# Patient Record
Sex: Male | Born: 1980 | Race: Black or African American | Hispanic: No | Marital: Single | Smoking: Current every day smoker
Health system: Southern US, Community
[De-identification: ages and names within clinical notes are randomized; demographics above are authoritative.]

## PROBLEM LIST (undated history)

## (undated) DIAGNOSIS — S61419A Laceration without foreign body of unspecified hand, initial encounter: Secondary | ICD-10-CM

## (undated) DIAGNOSIS — T07XXXA Unspecified multiple injuries, initial encounter: Secondary | ICD-10-CM

## (undated) DIAGNOSIS — J302 Other seasonal allergic rhinitis: Secondary | ICD-10-CM

## (undated) DIAGNOSIS — S8263XA Displaced fracture of lateral malleolus of unspecified fibula, initial encounter for closed fracture: Secondary | ICD-10-CM

## (undated) HISTORY — PX: NO PAST SURGERIES: SHX2092

---
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
## 1999-12-17 ENCOUNTER — Emergency Department (HOSPITAL_COMMUNITY): Admission: EM | Admit: 1999-12-17 | Discharge: 1999-12-17 | Payer: Self-pay | Admitting: *Deleted

## 2002-02-22 ENCOUNTER — Emergency Department (HOSPITAL_COMMUNITY): Admission: EM | Admit: 2002-02-22 | Discharge: 2002-02-22 | Payer: Self-pay | Admitting: Emergency Medicine

## 2002-02-22 ENCOUNTER — Encounter: Payer: Self-pay | Admitting: Emergency Medicine

## 2002-06-17 ENCOUNTER — Emergency Department (HOSPITAL_COMMUNITY): Admission: EM | Admit: 2002-06-17 | Discharge: 2002-06-18 | Payer: Self-pay | Admitting: Emergency Medicine

## 2010-10-23 ENCOUNTER — Emergency Department (HOSPITAL_COMMUNITY)
Admission: EM | Admit: 2010-10-23 | Discharge: 2010-10-24 | Disposition: A | Discharge status: Home / Self Care | Payer: No Typology Code available for payment source | Attending: Emergency Medicine | Admitting: Emergency Medicine

## 2010-10-23 DIAGNOSIS — IMO0002 Reserved for concepts with insufficient information to code with codable children: Secondary | ICD-10-CM | POA: Insufficient documentation

## 2010-10-23 DIAGNOSIS — Y92410 Unspecified street and highway as the place of occurrence of the external cause: Secondary | ICD-10-CM | POA: Insufficient documentation

## 2010-10-23 DIAGNOSIS — M25569 Pain in unspecified knee: Secondary | ICD-10-CM | POA: Insufficient documentation

## 2010-10-24 ENCOUNTER — Emergency Department (HOSPITAL_COMMUNITY): Payer: No Typology Code available for payment source

## 2010-10-24 ENCOUNTER — Encounter (HOSPITAL_COMMUNITY): Payer: Self-pay

## 2011-11-28 ENCOUNTER — Encounter (HOSPITAL_COMMUNITY): Payer: Self-pay

## 2011-11-28 ENCOUNTER — Emergency Department (HOSPITAL_COMMUNITY): Payer: BC Managed Care – PPO

## 2011-11-28 ENCOUNTER — Emergency Department (HOSPITAL_COMMUNITY)
Admission: EM | Admit: 2011-11-28 | Discharge: 2011-11-28 | Disposition: A | Discharge status: Home / Self Care | Payer: BC Managed Care – PPO | Attending: Emergency Medicine | Admitting: Emergency Medicine

## 2011-11-28 DIAGNOSIS — T07XXXA Unspecified multiple injuries, initial encounter: Secondary | ICD-10-CM

## 2011-11-28 DIAGNOSIS — S82839A Other fracture of upper and lower end of unspecified fibula, initial encounter for closed fracture: Secondary | ICD-10-CM

## 2011-11-28 DIAGNOSIS — S82899A Other fracture of unspecified lower leg, initial encounter for closed fracture: Secondary | ICD-10-CM | POA: Insufficient documentation

## 2011-11-28 DIAGNOSIS — IMO0002 Reserved for concepts with insufficient information to code with codable children: Secondary | ICD-10-CM | POA: Insufficient documentation

## 2011-11-28 DIAGNOSIS — S8263XA Displaced fracture of lateral malleolus of unspecified fibula, initial encounter for closed fracture: Secondary | ICD-10-CM

## 2011-11-28 DIAGNOSIS — F17200 Nicotine dependence, unspecified, uncomplicated: Secondary | ICD-10-CM | POA: Insufficient documentation

## 2011-11-28 DIAGNOSIS — S61411A Laceration without foreign body of right hand, initial encounter: Secondary | ICD-10-CM

## 2011-11-28 DIAGNOSIS — S61419A Laceration without foreign body of unspecified hand, initial encounter: Secondary | ICD-10-CM

## 2011-11-28 DIAGNOSIS — S61409A Unspecified open wound of unspecified hand, initial encounter: Secondary | ICD-10-CM | POA: Insufficient documentation

## 2011-11-28 HISTORY — DX: Unspecified multiple injuries, initial encounter: T07.XXXA

## 2011-11-28 HISTORY — DX: Displaced fracture of lateral malleolus of unspecified fibula, initial encounter for closed fracture: S82.63XA

## 2011-11-28 HISTORY — DX: Laceration without foreign body of unspecified hand, initial encounter: S61.419A

## 2011-11-28 MED ORDER — IBUPROFEN 800 MG PO TABS: 800.0000 mg | ORAL_TABLET | Freq: Three times a day (TID) | ORAL | Status: DC | PRN

## 2011-11-28 MED ORDER — OXYCODONE-ACETAMINOPHEN 5-325 MG PO TABS: 2.0000 | ORAL_TABLET | Freq: Four times a day (QID) | ORAL | Status: DC | PRN

## 2011-11-28 MED ORDER — OXYCODONE-ACETAMINOPHEN 5-325 MG PO TABS
2.0000 | ORAL_TABLET | Freq: Once | ORAL | Status: AC
  Administered 2011-11-28: 2 via ORAL
  Filled 2011-11-28: qty 2

## 2011-11-28 MED ORDER — DOCUSATE SODIUM 100 MG PO CAPS: 100.0000 mg | ORAL_CAPSULE | Freq: Two times a day (BID) | ORAL | Status: AC
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
## 2011-11-28 NOTE — Progress Notes (Signed)
Orthopedic Tech Progress Note Patient Details:  [name] [birth year] [id number]  Type of Splint: Post (short);Stirrup Splint Location: right leg Splint Interventions: Application    Eyan Hagood 11/28/2011, 10:50 PM

## 2011-11-28 NOTE — ED Notes (Signed)
PO called back and was notified pt has a fx.  She is on the way to the ED to remove the pt's monitoring device from his R ankle so that ortho can come.

## 2011-11-28 NOTE — ED Provider Notes (Addendum)
Patient stable but needs more thorough evaluation that was begun in triage.  Plan appropriate x-rays and moved to the back.  Nelia Shi, MD 11/28/11 1839  Nelia Shi, MD 11/28/11 1910

## 2011-11-28 NOTE — ED Notes (Signed)
R foot: Pedal pulses palpable and strong, cap refill <2sec, swelling present, foot elevated and ice pack applied, pt updated, pendin pt movement to exam room, for irrigation of wound and splinting. Family at [facility]. Pt calm, alert, NAD, interactive.

## 2011-11-28 NOTE — Discharge Instructions (Signed)
No weight bearing on right foot.   RICE: Routine Care for Injuries The routine care of many injuries includes Rest, Ice, Compression, and Elevation (RICE). HOME CARE INSTRUCTIONS  Rest is needed to allow your body to heal. Routine activities can usually be resumed when comfortable. Injured tendons and bones can take up to 6 weeks to heal. Tendons are the cord-like structures that attach muscle to bone.   Ice following an injury helps keep the swelling down and reduces pain.   Put ice in a plastic bag.   Place a towel between your skin and the bag.   Leave the ice on for 15 to 20 minutes, 3 to 4 times a day. Do this while awake, for the first 24 to 48 hours. After that, continue as directed by your caregiver.   Compression helps keep swelling down. It also gives support and helps with discomfort. If an elastic bandage has been applied, it should be removed and reapplied every 3 to 4 hours. It should not be applied tightly, but firmly enough to keep swelling down. Watch fingers or toes for swelling, bluish discoloration, coldness, numbness, or excessive pain. If any of these problems occur, remove the bandage and reapply loosely. Contact your caregiver if these problems continue.   Elevation helps reduce swelling and decreases pain. With extremities, such as the arms, hands, legs, and feet, the injured area should be placed near or above the level of the heart, if possible.  SEEK IMMEDIATE MEDICAL CARE IF:  You have persistent pain and swelling.   You develop redness, numbness, or unexpected weakness.   Your symptoms are getting worse rather than improving after several days.  These symptoms may indicate that further evaluation or further X-rays are needed. Sometimes, X-rays may not show a small broken bone (fracture) until 1 week or 10 days later. Make a follow-up appointment with your caregiver. Ask when your X-ray results will be ready. Make sure you get your X-ray results. Document  Released: 10/26/2000 Document Revised: 07/03/2011 Document Reviewed: 12/13/2010 [facility] Patient Information 2012 [place], [place].  Abrasions Abrasions are skin scrapes. Their treatment depends on how large and deep the abrasion is. Abrasions do not extend through all layers of the skin. A cut or lesion through all skin layers is called a laceration. HOME CARE INSTRUCTIONS   If you were given a dressing, change it at least once a day or as instructed by your caregiver. If the bandage sticks, soak it off with a solution of water or hydrogen peroxide.   Twice a day, wash the area with soap and water to remove all the cream/ointment. You may do this in a sink, under a tub faucet, or in a shower. Rinse off the soap and pat dry with a clean towel. Look for signs of infection (see below).   Reapply cream/ointment according to your caregiver's instruction. This will help prevent infection and keep the bandage from sticking. Telfa or gauze over the wound and under the dressing or wrap will also help keep the bandage from sticking.   If the bandage becomes wet, dirty, or develops a foul smell, change it as soon as possible.   Only take over-the-counter or prescription medicines for pain, discomfort, or fever as directed by your caregiver.  SEEK IMMEDIATE MEDICAL CARE IF:   Increasing pain in the wound.   Signs of infection develop: redness, swelling, surrounding area is tender to touch, or pus coming from the wound.   You have a fever.  Any foul smell coming from the wound or dressing.  Most skin wounds heal within ten days. Facial wounds heal faster. However, an infection may occur despite proper treatment. You should have the wound checked for signs of infection within 24 to 48 hours or sooner if problems arise. If you were not given a wound-check appointment, look closely at the wound yourself on the second day for early signs of infection listed above. MAKE SURE YOU:   Understand these  instructions.   Will watch your condition.   Will get help right away if you are not doing well or get worse.  Document Released: 04/23/2005 Document Revised: 07/03/2011 Document Reviewed: 06/17/2011 [facility] Patient Information 2012 [place], [place]. Crutch Use You have been prescribed crutches to take weight off one of your lower legs or feet (extremities). When using crutches, make sure you are not putting pressure on the armpit (axilla). This could cause damage to the nerves that extend from your axilla to the hand and arm. When fitted properly the crutches should be 2 to 3 finger widths below the axilla. Your weight should be supported by your hand, and not by resting upon the crutch with the axilla. When walking, first step with the crutches, then swing the healthy leg through and slightly ahead. When going up stairs, first step up with the healthy leg and then follow with the crutches and injured leg up to the same step, and so forth. If there is a handrail, hold both crutches in one hand, place your other hand on the handrail, and while placing your weight on your arms, lift your good leg to the step, then bring the crutches and the injured leg up to that step. Repeat for each step. When going down stairs, first step with the injured leg and crutches, following down with the healthy leg to the same step. Be very careful, as going down stairs with crutches is very challenging. If you feel wobbly or nervous, sit down and inch yourself down the stairs on your butt. To get up from a chair, hold injured leg forward, grab armrest with one hand and the top of the crutches with the other hand. Using these supports, pull yourself up to a standing position. Reverse this procedure for sitting. See your caregiver for follow up as suggested. If you are discharged in an ace wrap and develop numbness, tingling, swelling, or increased pain, loosen the ace wrap and re-wrap looser. If these problems persist, see  your caregiver as needed. If you have been instructed to use partial weight bearing, bear (apply) the amount of weight as suggested by your caregiver. Do not bear weight in an amount that causes pain on the area of injury. Document Released: 07/11/2000 Document Revised: 07/03/2011 Document Reviewed: 09/18/2008 [facility] Patient Information 2012 [place], [place].Ankle Fracture A fracture is a break in the bone. A cast or splint is used to protect and keep your injured bone from moving.  HOME CARE INSTRUCTIONS   Use your crutches as directed.   To lessen the swelling, keep the injured leg elevated while sitting or lying down.   Apply ice to the injury for 15 to 20 minutes, 3 to 4 times per day while awake for 2 days. Put the ice in a plastic bag and place a thin towel between the bag of ice and your cast.   If you have a plaster or fiberglass cast:   Do not try to scratch the skin under the cast using sharp or pointed  objects.   Check the skin around the cast every day. You may put lotion on any red or sore areas.   Keep your cast dry and clean.   If you have a plaster splint:   Wear the splint as directed.   You may loosen the elastic around the splint if your toes become numb, tingle, or turn cold or blue.   Do not put pressure on any part of your cast or splint; it may break. Rest your cast only on a pillow the first 24 hours until it is fully hardened.   Your cast or splint can be protected during bathing with a plastic bag. Do not lower the cast or splint into water.   Take medications as directed by your caregiver. Only take over-the-counter or prescription medicines for pain, discomfort, or fever as directed by your caregiver.   Do not drive a vehicle until your caregiver specifically tells you it is safe to do so.   If your caregiver has given you a follow-up appointment, it is very important to keep that appointment. Not keeping the appointment could result in a chronic or  permanent injury, pain, and disability. If there is any problem keeping the appointment, you must call back to this facility for assistance.  SEEK IMMEDIATE MEDICAL CARE IF:   Your cast gets damaged or breaks.  You have continued severe pain or mor Laceration Care, Adult A laceration is a cut or lesion that goes through all layers of the skin and into the tissue just beneath the skin. TREATMENT  Some lacerations may not require closure. Some lacerations may not be able to be closed due to an increased risk of infection. It is important to see your caregiver as soon as possible after an injury to minimize the risk of infection and maximize the opportunity for successful closure. If closure is appropriate, pain medicines may be given, if needed. The wound will be cleaned to help prevent infection. Your caregiver will use stitches (sutures), staples, wound glue (adhesive), or skin adhesive strips to repair the laceration. These tools bring the skin edges together to allow for faster healing and a better cosmetic outcome. However, all wounds will heal with a scar. Once the wound has healed, scarring can be minimized by covering the wound with sunscreen during the day for 1 full year. HOME CARE INSTRUCTIONS  For sutures or staples:  Keep the wound clean and dry.   If you were given a bandage (dressing), you should change it at least once a day. Also, change the dressing if it becomes wet or dirty, or as directed by your caregiver.   Wash the wound with soap and water 2 times a day. Rinse the wound off with water to remove all soap. Pat the wound dry with a clean towel.   After cleaning, apply a thin layer of the antibiotic ointment as recommended by your caregiver. This will help prevent infection and keep the dressing from sticking.   You may shower as usual after the first 24 hours. Do not soak the wound in water until the sutures are removed.   Only take over-the-counter or prescription  medicines for pain, discomfort, or fever as directed by your caregiver.   Get your sutures or staples removed as directed by your caregiver.  For skin adhesive strips:  Keep the wound clean and dry.   Do not get the skin adhesive strips wet. You may bathe carefully, using caution to keep the wound dry.  If the wound gets wet, pat it dry with a clean towel.   Skin adhesive strips will fall off on their own. You may trim the strips as the wound heals. Do not remove skin adhesive strips that are still stuck to the wound. They will fall off in time.  For wound adhesive:  You may briefly wet your wound in the shower or bath. Do not soak or scrub the wound. Do not swim. Avoid periods of heavy perspiration until the skin adhesive has fallen off on its own. After showering or bathing, gently pat the wound dry with a clean towel.   Do not apply liquid medicine, cream medicine, or ointment medicine to your wound while the skin adhesive is in place. This may loosen the film before your wound is healed.   If a dressing is placed over the wound, be careful not to apply tape directly over the skin adhesive. This may cause the adhesive to be pulled off before the wound is healed.   Avoid prolonged exposure to sunlight or tanning lamps while the skin adhesive is in place. Exposure to ultraviolet light in the first year will darken the scar.   The skin adhesive will usually remain in place for 5 to 10 days, then naturally fall off the skin. Do not pick at the adhesive film.  You may need a tetanus shot if:  You cannot remember when you had your last tetanus shot.   You have never had a tetanus shot.  If you get a tetanus shot, your arm may swell, get red, and feel warm to the touch. This is common and not a problem. If you need a tetanus shot and you choose not to have one, there is a rare chance of getting tetanus. Sickness from tetanus can be serious. SEEK MEDICAL CARE IF:   You have redness,  swelling, or increasing pain in the wound.   You see a red line that goes away from the wound.   You have yellowish-white fluid (pus) coming from the wound.   You have a fever.   You notice a bad smell coming from the wound or dressing.   Your wound breaks open before or after sutures have been removed.   You notice something coming out of the wound such as wood or glass.   Your wound is on your hand or foot and you cannot move a finger or toe.  SEEK IMMEDIATE MEDICAL CARE IF:   Your pain is not controlled with prescribed medicine.   You have severe swelling around the wound causing pain and numbness or a change in color in your arm, hand, leg, or foot.   Your wound splits open and starts bleeding.   You have worsening numbness, weakness, or loss of function of any joint around or beyond the wound.   You develop painful lumps near the wound or on the skin anywhere on your body.  MAKE SURE YOU:   Understand these instructions.   Will watch your condition.   Will get help right away if you are not doing well or get worse.  Document Released: 07/14/2005 Document Revised: 07/03/2011 Document Reviewed: 01/07/2011  [employer] Patient Information 2012 [employer]e swelling than you did before the cast was put on.   Your skin or toenails below the injury turn blue or gray, or feel cold or numb.   There is a bad smell or new stains and/or purulent (pus like) drainage coming from under the cast.  If  you do not have a window in your cast for observing the wound, a discharge or minor bleeding may show up as a stain on the outside of your cast. Report these findings to your caregiver. MAKE SURE YOU:   Understand these instructions.   Will watch your condition.   Will get help right away if you are not doing well or get worse.  Document Released: 07/11/2000 Document Revised: 07/03/2011 Document Reviewed: 02/15/2008 [facility] Patient Information 2012 [place], [place].
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
## 2011-11-28 NOTE — ED Notes (Signed)
[facility] called for splint right lower

## 2011-11-28 NOTE — ED Notes (Signed)
Pt's son given Happy Meal, Juice, crackers and cheese. Wife given water. No other needs voiced.

## 2011-11-30 NOTE — ED Provider Notes (Signed)
I saw and evaluated the patient, reviewed the resident's note and I agree with the findings and plan.  Toy Baker, MD 11/30/11 1050

## 2011-12-02 ENCOUNTER — Encounter (HOSPITAL_BASED_OUTPATIENT_CLINIC_OR_DEPARTMENT_OTHER): Payer: Self-pay | Admitting: *Deleted

## 2011-12-03 ENCOUNTER — Encounter (HOSPITAL_BASED_OUTPATIENT_CLINIC_OR_DEPARTMENT_OTHER): Payer: Self-pay | Admitting: Certified Registered Nurse Anesthetist

## 2011-12-03 ENCOUNTER — Ambulatory Visit (HOSPITAL_BASED_OUTPATIENT_CLINIC_OR_DEPARTMENT_OTHER): Payer: BC Managed Care – PPO | Admitting: Certified Registered Nurse Anesthetist

## 2011-12-03 ENCOUNTER — Encounter (HOSPITAL_BASED_OUTPATIENT_CLINIC_OR_DEPARTMENT_OTHER)
Admission: RE | Disposition: A | Discharge status: Home / Self Care | Payer: Self-pay | Source: Ambulatory Visit | Attending: Orthopedic Surgery

## 2011-12-03 ENCOUNTER — Ambulatory Visit (HOSPITAL_BASED_OUTPATIENT_CLINIC_OR_DEPARTMENT_OTHER)
Admission: RE | Admit: 2011-12-03 | Discharge: 2011-12-03 | Disposition: A | Discharge status: Home / Self Care | Payer: BC Managed Care – PPO | Source: Ambulatory Visit | Attending: Orthopedic Surgery | Admitting: Orthopedic Surgery

## 2011-12-03 ENCOUNTER — Encounter (HOSPITAL_BASED_OUTPATIENT_CLINIC_OR_DEPARTMENT_OTHER): Payer: Self-pay

## 2011-12-03 DIAGNOSIS — S61419A Laceration without foreign body of unspecified hand, initial encounter: Secondary | ICD-10-CM

## 2011-12-03 DIAGNOSIS — S82891A Other fracture of right lower leg, initial encounter for closed fracture: Secondary | ICD-10-CM

## 2011-12-03 DIAGNOSIS — S8263XA Displaced fracture of lateral malleolus of unspecified fibula, initial encounter for closed fracture: Secondary | ICD-10-CM | POA: Insufficient documentation

## 2011-12-03 DIAGNOSIS — W19XXXA Unspecified fall, initial encounter: Secondary | ICD-10-CM | POA: Insufficient documentation

## 2011-12-03 HISTORY — PX: ORIF ANKLE FRACTURE: SHX5408

## 2011-12-03 HISTORY — DX: Unspecified multiple injuries, initial encounter: T07.XXXA

## 2011-12-03 HISTORY — DX: Displaced fracture of lateral malleolus of unspecified fibula, initial encounter for closed fracture: S82.63XA

## 2011-12-03 HISTORY — DX: Other seasonal allergic rhinitis: J30.2

## 2011-12-03 HISTORY — DX: Laceration without foreign body of unspecified hand, initial encounter: S61.419A

## 2011-12-03 SURGERY — OPEN REDUCTION INTERNAL FIXATION (ORIF) ANKLE FRACTURE
Anesthesia: General | Site: Ankle | Laterality: Right | Wound class: Clean

## 2011-12-03 MED ORDER — ONDANSETRON HCL 4 MG/2ML IJ SOLN
INTRAMUSCULAR | Status: DC | PRN
  Administered 2011-12-03: 4 mg via INTRAVENOUS

## 2011-12-03 MED ORDER — LIDOCAINE HCL (CARDIAC) 20 MG/ML IV SOLN
INTRAVENOUS | Status: DC | PRN
  Administered 2011-12-03: 30 mg via INTRAVENOUS

## 2011-12-03 MED ORDER — "XEROFORM PETROLAT GAUZE 1""X8"" EX MISC": 1.0000 | CUTANEOUS | Status: DC

## 2011-12-03 MED ORDER — HYDROMORPHONE HCL PF 1 MG/ML IJ SOLN: 0.2500 mg | INTRAMUSCULAR | Status: DC | PRN

## 2011-12-03 MED ORDER — ASPIRIN EC 325 MG PO TBEC: 325.0000 mg | DELAYED_RELEASE_TABLET | Freq: Two times a day (BID) | ORAL | Status: AC

## 2011-12-03 MED ORDER — FENTANYL CITRATE 0.05 MG/ML IJ SOLN
INTRAMUSCULAR | Status: DC | PRN
  Administered 2011-12-03: 50 ug via INTRAVENOUS

## 2011-12-03 MED ORDER — CHLORHEXIDINE GLUCONATE 4 % EX LIQD: 60.0000 mL | Freq: Once | CUTANEOUS | Status: DC

## 2011-12-03 MED ORDER — DEXAMETHASONE SODIUM PHOSPHATE 10 MG/ML IJ SOLN
INTRAMUSCULAR | Status: DC | PRN
  Administered 2011-12-03: 10 mg via INTRAVENOUS

## 2011-12-03 MED ORDER — ACETAMINOPHEN 10 MG/ML IV SOLN
1000.0000 mg | Freq: Once | INTRAVENOUS | Status: AC
  Administered 2011-12-03: 1000 mg via INTRAVENOUS

## 2011-12-03 MED ORDER — OXYCODONE-ACETAMINOPHEN 5-325 MG PO TABS: 2.0000 | ORAL_TABLET | Freq: Four times a day (QID) | ORAL | Status: AC | PRN

## 2011-12-03 MED ORDER — PROPOFOL 10 MG/ML IV EMUL
INTRAVENOUS | Status: DC | PRN
  Administered 2011-12-03: 300 mg via INTRAVENOUS

## 2011-12-03 MED ORDER — VITAMIN C 500 MG PO TABS: 500.0000 mg | ORAL_TABLET | Freq: Every day | ORAL | Status: AC

## 2011-12-03 MED ORDER — MIDAZOLAM HCL 2 MG/2ML IJ SOLN
2.0000 mg | INTRAMUSCULAR | Status: DC | PRN
  Administered 2011-12-03: 2 mg via INTRAVENOUS

## 2011-12-03 MED ORDER — CEFAZOLIN SODIUM-DEXTROSE 2-3 GM-% IV SOLR
2.0000 g | INTRAVENOUS | Status: AC
  Administered 2011-12-03: 2 g via INTRAVENOUS

## 2011-12-03 MED ORDER — METHOCARBAMOL 500 MG PO TABS: 500.0000 mg | ORAL_TABLET | Freq: Three times a day (TID) | ORAL | Status: AC

## 2011-12-03 MED ORDER — MIDAZOLAM HCL 5 MG/5ML IJ SOLN
INTRAMUSCULAR | Status: DC | PRN
  Administered 2011-12-03: 1 mg via INTRAVENOUS

## 2011-12-03 MED ORDER — BUPIVACAINE-EPINEPHRINE PF 0.5-1:200000 % IJ SOLN
INTRAMUSCULAR | Status: DC | PRN
  Administered 2011-12-03: 30 mL

## 2011-12-03 MED ORDER — LACTATED RINGERS IV SOLN
INTRAVENOUS | Status: DC
  Administered 2011-12-03 (×2): via INTRAVENOUS

## 2011-12-03 MED ORDER — CEFAZOLIN SODIUM 1-5 GM-% IV SOLN: 1.0000 g | INTRAVENOUS | Status: DC

## 2011-12-03 MED ORDER — FENTANYL CITRATE 0.05 MG/ML IJ SOLN
100.0000 ug | INTRAMUSCULAR | Status: DC | PRN
  Administered 2011-12-03: 100 ug via INTRAVENOUS

## 2011-12-03 MED ORDER — SODIUM CHLORIDE 0.9 % IV SOLN: INTRAVENOUS | Status: DC

## 2011-12-03 MED ORDER — BUPIVACAINE HCL (PF) 0.5 % IJ SOLN
INTRAMUSCULAR | Status: DC | PRN
  Administered 2011-12-03: 10 mL

## 2011-12-03 SURGICAL SUPPLY — 66 items
BANDAGE ELASTIC 4 VELCRO ST LF (GAUZE/BANDAGES/DRESSINGS) ×2 IMPLANT
BANDAGE ELASTIC 6 VELCRO ST LF (GAUZE/BANDAGES/DRESSINGS) ×2 IMPLANT
BIT DRILL 2.5X110 QC LCP DISP (BIT) ×2 IMPLANT
BLADE SURG 15 STRL LF DISP TIS (BLADE) ×2 IMPLANT
BLADE SURG 15 STRL SS (BLADE) ×2
BRUSH SCRUB EZ PLAIN DRY (MISCELLANEOUS) ×4 IMPLANT
BUR EGG 3PK/BX (BURR) IMPLANT
CLOTH BEACON ORANGE TIMEOUT ST (SAFETY) ×2 IMPLANT
COTTON STERILE ROLL (GAUZE/BANDAGES/DRESSINGS) ×2 IMPLANT
COVER TABLE BACK 60X90 (DRAPES) ×2 IMPLANT
CUFF TOURNIQUET SINGLE 34IN LL (TOURNIQUET CUFF) ×2 IMPLANT
DRAPE EXTREMITY T 121X128X90 (DRAPE) ×2 IMPLANT
DRAPE OEC MINIVIEW 54X84 (DRAPES) ×2 IMPLANT
DRAPE SURG 17X23 STRL (DRAPES) ×2 IMPLANT
DRSG PAD ABDOMINAL 8X10 ST (GAUZE/BANDAGES/DRESSINGS) ×2 IMPLANT
DURA STEPPER LG (CAST SUPPLIES) IMPLANT
DURA STEPPER MED (CAST SUPPLIES) IMPLANT
ELECT REM PT RETURN 9FT ADLT (ELECTROSURGICAL) ×2
ELECTRODE REM PT RTRN 9FT ADLT (ELECTROSURGICAL) ×1 IMPLANT
GAUZE SPONGE 4X4 16PLY XRAY LF (GAUZE/BANDAGES/DRESSINGS) IMPLANT
GAUZE XEROFORM 1X8 LF (GAUZE/BANDAGES/DRESSINGS) ×4 IMPLANT
GAUZE XEROFORM 5X9 LF (GAUZE/BANDAGES/DRESSINGS) IMPLANT
GLOVE BIO SURGEON STRL SZ 6.5 (GLOVE) ×4 IMPLANT
GLOVE BIO SURGEON STRL SZ8 (GLOVE) ×2 IMPLANT
GLOVE BIOGEL PI IND STRL 8 (GLOVE) ×2 IMPLANT
GLOVE BIOGEL PI INDICATOR 8 (GLOVE) ×2
GLOVE INDICATOR 7.0 STRL GRN (GLOVE) ×2 IMPLANT
GLOVE SURG SS PI 8.0 STRL IVOR (GLOVE) ×2 IMPLANT
GOWN BRE IMP PREV XXLGXLNG (GOWN DISPOSABLE) ×2 IMPLANT
GOWN PREVENTION PLUS XLARGE (GOWN DISPOSABLE) ×4 IMPLANT
KIT 1/3 TUB PL 4H 49M (Orthopedic Implant) ×1 IMPLANT
NEEDLE HYPO 22GX1.5 SAFETY (NEEDLE) IMPLANT
NS IRRIG 1000ML POUR BTL (IV SOLUTION) ×2 IMPLANT
PACK BASIN DAY SURGERY FS (CUSTOM PROCEDURE TRAY) ×2 IMPLANT
PAD CAST 4YDX4 CTTN HI CHSV (CAST SUPPLIES) ×2 IMPLANT
PADDING CAST ABS 4INX4YD NS (CAST SUPPLIES) ×2
PADDING CAST ABS COTTON 4X4 ST (CAST SUPPLIES) ×2 IMPLANT
PADDING CAST COTTON 4X4 STRL (CAST SUPPLIES) ×2
PENCIL BUTTON HOLSTER BLD 10FT (ELECTRODE) ×2 IMPLANT
PROS 1/3 TUB PL 4H 49M (Orthopedic Implant) ×2 IMPLANT
SCREW CORTEX 3.5 18MM (Screw) ×2 IMPLANT
SCREW CORTEX 3.5 24MM (Screw) ×1 IMPLANT
SCREW CORTEX 3.5 28MM (Screw) ×1 IMPLANT
SCREW LOCK CORT ST 3.5X18 (Screw) ×2 IMPLANT
SCREW LOCK CORT ST 3.5X24 (Screw) ×1 IMPLANT
SCREW LOCK CORT ST 3.5X28 (Screw) ×1 IMPLANT
SHEET MEDIUM DRAPE 40X70 STRL (DRAPES) ×4 IMPLANT
SLEEVE SCD COMPRESS KNEE MED (MISCELLANEOUS) IMPLANT
SPLINT FAST PLASTER 5X30 (CAST SUPPLIES) ×20
SPLINT PLASTER CAST FAST 5X30 (CAST SUPPLIES) ×20 IMPLANT
SPONGE GAUZE 4X4 12PLY (GAUZE/BANDAGES/DRESSINGS) ×4 IMPLANT
STOCKINETTE 6  STRL (DRAPES) ×1
STOCKINETTE 6 STRL (DRAPES) ×1 IMPLANT
SUCTION FRAZIER TIP 10 FR DISP (SUCTIONS) IMPLANT
SUT ETHILON 3 0 PS 1 (SUTURE) IMPLANT
SUT ETHILON 4 0 PS 2 18 (SUTURE) ×2 IMPLANT
SUT VIC AB 2-0 SH 27 (SUTURE)
SUT VIC AB 2-0 SH 27XBRD (SUTURE) IMPLANT
SUT VIC AB 3-0 PS1 18 (SUTURE) ×1
SUT VIC AB 3-0 PS1 18XBRD (SUTURE) ×1 IMPLANT
SYR BULB 3OZ (MISCELLANEOUS) ×2 IMPLANT
TOWEL OR 17X24 6PK STRL BLUE (TOWEL DISPOSABLE) ×6 IMPLANT
TOWEL OR NON WOVEN STRL DISP B (DISPOSABLE) ×2 IMPLANT
TUBE CONNECTING 20X1/4 (TUBING) ×4 IMPLANT
UNDERPAD 30X30 INCONTINENT (UNDERPADS AND DIAPERS) ×2 IMPLANT
WATER STERILE IRR 1000ML POUR (IV SOLUTION) ×2 IMPLANT

## 2011-12-03 NOTE — Progress Notes (Signed)
Assisted Dr. Fitzgerald with right, ultrasound guided, popliteal/saphenous block. Side rails up, monitors on throughout procedure. See vital signs in flow sheet. Tolerated Procedure well. 

## 2011-12-03 NOTE — Brief Op Note (Signed)
12/03/2011  2:27 PM  PATIENT:  [name]  31 y.o. male  PRE-OPERATIVE DIAGNOSIS:  right lateral malleolus fracture  POST-OPERATIVE DIAGNOSIS:  right lateral malleolus fracture  PROCEDURE:  Procedure(s) (LRB): OPEN REDUCTION INTERNAL FIXATION (ORIF) ANKLE FRACTURE (Right)  SURGEON:  Surgeon(s) and Role:    * Sherri Rad, MD - Primary  PHYSICIAN ASSISTANT: Rexene Edison, PAC   ASSISTANTS: Above   ANESTHESIA:   general  EBL:  Total I/O In: 1500 [I.V.:1500] Out: -   BLOOD ADMINISTERED:none  DRAINS: none   LOCAL MEDICATIONS USED:  NONE  SPECIMEN:  No Specimen  DISPOSITION OF SPECIMEN:  N/A  COUNTS:  YES  TOURNIQUET:  * Missing tourniquet times found for documented tourniquets in log:  [id number] *  DICTATION: .Other Dictation: Dictation Number [id number]  PLAN OF CARE: Discharge to home after PACU  PATIENT DISPOSITION:  PACU - hemodynamically stable.   Delay start of Pharmacological VTE agent (>24hrs) due to surgical blood loss or risk of bleeding: no

## 2011-12-03 NOTE — Anesthesia Preprocedure Evaluation (Addendum)
Anesthesia Evaluation  Patient identified by MRN, date of birth, ID band Patient awake    Reviewed: Allergy & Precautions, H&P , NPO status , Patient's Chart, lab work & pertinent test results  Airway Mallampati: I TM Distance: >3 FB Neck ROM: Full    Dental No notable dental hx. (+) Teeth Intact and Dental Advisory Given   Pulmonary neg pulmonary ROS,  breath sounds clear to auscultation  Pulmonary exam normal       Cardiovascular negative cardio ROS  Rhythm:Regular Rate:Normal     Neuro/Psych negative neurological ROS  negative psych ROS   GI/Hepatic negative GI ROS, Neg liver ROS,   Endo/Other  negative endocrine ROS  Renal/GU negative Renal ROS  negative genitourinary   Musculoskeletal   Abdominal   Peds  Hematology negative hematology ROS (+)   Anesthesia Other Findings   Reproductive/Obstetrics negative OB ROS                           Anesthesia Physical Anesthesia Plan  ASA: II  Anesthesia Plan: General   Post-op Pain Management:    Induction: Intravenous  Airway Management Planned: LMA  Additional Equipment:   Intra-op Plan:   Post-operative Plan: Extubation in OR  Informed Consent: I have reviewed the patients History and Physical, chart, labs and discussed the procedure including the risks, benefits and alternatives for the proposed anesthesia with the patient or authorized representative who has indicated his/her understanding and acceptance.   Dental advisory given  Plan Discussed with: CRNA  Anesthesia Plan Comments:         Anesthesia Quick Evaluation  

## 2011-12-03 NOTE — Transfer of Care (Signed)
Immediate Anesthesia Transfer of Care Note  Patient: [name]  Procedure(s) Performed: Procedure(s) (LRB): OPEN REDUCTION INTERNAL FIXATION (ORIF) ANKLE FRACTURE (Right)  Patient Location: PACU  Anesthesia Type: General and Regional  Level of Consciousness: awake, sedated and patient cooperative  Airway & Oxygen Therapy: Patient Spontanous Breathing and Patient connected to face mask oxygen  Post-op Assessment: Report given to PACU RN and Post -op Vital signs reviewed and stable  Post vital signs: Reviewed and stable  Complications: No apparent anesthesia complications

## 2011-12-03 NOTE — Anesthesia Procedure Notes (Addendum)
Anesthesia Regional Block:  Popliteal block  Pre-Anesthetic Checklist: ,, timeout performed, Correct Patient, Correct Site, Correct Laterality, Correct Procedure, Correct Position, site marked, Risks and benefits discussed, pre-op evaluation, post-op pain management  Laterality: Right  Prep: Maximum Sterile Barrier Precautions used and chloraprep       Needles:  Injection technique: Single-shot  Needle Type: Echogenic Stimulator Needle          Additional Needles:  Procedures: ultrasound guided and nerve stimulator Popliteal block  Nerve Stimulator or Paresthesia:  Response: Peroneal, 0.4 mA,  Response: Tibial,   Additional Responses:   Narrative:  Start time: 12/03/2011 12:05 PM End time: 12/03/2011 12:20 PM Injection made incrementally with aspirations every 5 mL. Anesthesiologist: Sampson Goon, MD  Additional Notes: 2% Lidocaine skin wheel. Saphenous block above the ankle with 10cc of 0.5% Bupiv plain.  Popliteal block Procedure Name: LMA Insertion Date/Time: 12/03/2011 1:33 PM Performed by: Shonte Soderlund D Pre-anesthesia Checklist: Patient identified, Emergency Drugs available, Suction available and Patient being monitored Patient Re-evaluated:Patient Re-evaluated prior to inductionOxygen Delivery Method: Circle System Utilized Preoxygenation: Pre-oxygenation with 100% oxygen Intubation Type: IV induction Ventilation: Mask ventilation without difficulty LMA: LMA inserted LMA Size: 5.0 Number of attempts: 1 Placement Confirmation: positive ETCO2 Tube secured with: Tape Dental Injury: Teeth and Oropharynx as per pre-operative assessment

## 2011-12-03 NOTE — H&P (Signed)
  H&P documentation: Placed to be scanned history and physical exam in chart.  -History and Physical Reviewed  -Patient has been re-examined  -No change in the plan of care  Kanija Remmel A  

## 2011-12-03 NOTE — Anesthesia Postprocedure Evaluation (Signed)
  Anesthesia Post-op Note  Patient: [name]  Procedure(s) Performed: Procedure(s) (LRB): OPEN REDUCTION INTERNAL FIXATION (ORIF) ANKLE FRACTURE (Right)  Patient Location: PACU  Anesthesia Type: GA combined with regional for post-op pain  Level of Consciousness: awake, alert  and oriented  Airway and Oxygen Therapy: Patient Spontanous Breathing  Post-op Pain: none  Post-op Assessment: Post-op Vital signs reviewed, Patient's Cardiovascular Status Stable, Respiratory Function Stable, Patent Airway and No signs of Nausea or vomiting  Post-op Vital Signs: Reviewed and stable  Complications: No apparent anesthesia complications

## 2011-12-03 NOTE — Discharge Instructions (Signed)
    Regional Anesthesia Blocks  1. Numbness or the inability to move the "blocked" extremity may last from 3-48 hours after placement. The length of time depends on the medication injected and your individual response to the medication. If the numbness is not going away after 48 hours, call your surgeon.  2. The extremity that is blocked will need to be protected until the numbness is gone and the  Strength has returned. Because you cannot feel it, you will need to take extra care to avoid injury. Because it may be weak, you may have difficulty moving it or using it. You may not know what position it is in without looking at it while the block is in effect.  3. For blocks in the legs and feet, returning to weight bearing and walking needs to be done carefully. You will need to wait until the numbness is entirely gone and the strength has returned. You should be able to move your leg and foot normally before you try and bear weight or walk. You will need someone to be with you when you first try to ensure you do not fall and possibly risk injury.  4. Bruising and tenderness at the needle site are common side effects and will resolve in a few days.  5. Persistent numbness or new problems with movement should be communicated to the surgeon or the [facility] [phone] [facility] [phone]).    Post Anesthesia Home Care Instructions  Activity: Get plenty of rest for the remainder of the day. A responsible adult should stay with you for 24 hours following the procedure.  For the next 24 hours, DO NOT: -Drive a car -Advertising copywriter -Drink alcoholic beverages -Take any medication unless instructed by your physician -Make any legal decisions or sign important papers.  Meals: Start with liquid foods such as gelatin or soup. Progress to regular foods as tolerated. Avoid greasy, spicy, heavy foods. If nausea and/or vomiting occur, drink only clear liquids until  the nausea and/or vomiting subsides. Call your physician if vomiting continues.  Special Instructions/Symptoms: Your throat may feel dry or sore from the anesthesia or the breathing tube placed in your throat during surgery. If this causes discomfort, gargle with warm salt water. The discomfort should disappear within 24 hours.      Bring CAM walker boot to next office visit with Dr. Lestine Box

## 2011-12-04 NOTE — Op Note (Signed)
NAMETallie, [name]                 ACCOUNT NO.:  [id number]  MEDICAL RECORD NO.:  [id number]  LOCATION:                                 FACILITY:  PHYSICIAN:  Leonides Grills, M.D.     DATE OF BIRTH:  Jun 23, 1981  DATE OF PROCEDURE:  12/03/2011 DATE OF DISCHARGE:                              OPERATIVE REPORT   PREOPERATIVE DIAGNOSIS:  Right SER IV equivalent lateral malleolus fracture.  POSTOPERATIVE DIAGNOSIS:  Right SER IV equivalent lateral malleolus fracture.  OPERATIVE PROCEDURE: 1. Open reduction and internal fixation of right lateral malleolus     fracture. 2. Stress x-rays, right ankle.  ANESTHESIA:  General.  SURGEON:  Leonides Grills, M.D.  ASSISTANT:  Richardean Canal, PA-C.  ESTIMATED BLOOD LOSS:  Minimal.  TOURNIQUET TIME:  Approximately 45 minutes.  COMPLICATIONS:  None.  DISPOSITION:  Stable to PR.  INDICATION:  This is a 31 year old gentleman, who fell, sustained the above injury.  He was consented for the above procedure.  All risks of infection or vessel injury, nonunion, malunion, hardware irritation, hardware failure, persistent pain, worse pain, prolonged recovery, stiffness, arthritis, wound healing problems, DVT, PE, and the fact that he may develop a syndesmotic ligament injury that may require fixation were all explained.  Questions were encouraged and answered.  OPERATION:  The patient was brought to the operating room and placed in supine position.  After adequate general anesthesia was administered as well as Ancef 1 g IV piggyback.  A bump was placed on the right ipsilateral hip, internally rotating right lower extremity.  All bony prominences were well padded.  Right lower extremity was then prepped and draped in sterile manner and proximally placed thigh tourniquet. Limb was gravity exsanguinated and tourniquet was elevated to 190 mmHg. A longitudinal incision, full thickness, directly down to bone was made over the lateral malleolus.  A  full-thickness flap was then developed off the lateral malleolus.  Hematoma and periosteum was removed from the fracture site carefully over the entire lateral aspect of the fracture site, extending both anteriorly and posteriorly.  We then anatomically reduced the fracture with a 2-point reduction clamp.  We then placed a 3.5-mm fully-threaded cortical lag screw using a 3.5, 2.5-mm drill hole respectively.  This had excellent purchase and maintenance of the anatomic reduction.  With the Interfrag screw in place, we then removed the reduction clamp and applied a 4 hole one-third tubular plate in an antiglide configuration.  With the plate in its proper position, we first placed the screw just proximal to the fracture site.  This was a 3.5-mm fully-threaded cortical set screw using a 2.5-mm drill hole respectively.  This had excellent purchase and maintenance of the desired position.  We then placed a 3.5-mm fully-threaded cortical set screw proximal and distal to the screw as well using a 2.5-mm drill hole respectively.  Again, this had excellent purchase and maintenance of the desired position.  We then obtained a stress x-ray in AP, lateral, and mortise views.  This showed no gross motion fixation, proposition, excellent alignment as well.  There was no lateral subluxation of the talus within the ankle mortise or diastasis of the distal  tib-fib syndesmosis.  The area was copiously irrigated with normal saline. Tourniquet was deflated.  Hemostasis was obtained.  Subcu was closed with 0 Vicryl, skin was closed with 4-0 nylon.  Sterile dressing was applied.  Modified Jones dressing was applied with the ankle in neutral dorsiflexion.  The patient was stable to the PR.     Leonides Grills, M.D.     PB/MEDQ  D:  12/03/2011  T:  12/04/2011  Job:  [id number]

## 2011-12-05 ENCOUNTER — Encounter (HOSPITAL_BASED_OUTPATIENT_CLINIC_OR_DEPARTMENT_OTHER): Payer: Self-pay | Admitting: Orthopedic Surgery

## 2011-12-16 ENCOUNTER — Encounter (HOSPITAL_COMMUNITY): Payer: Self-pay | Admitting: *Deleted

## 2011-12-16 ENCOUNTER — Emergency Department (HOSPITAL_COMMUNITY)
Admission: EM | Admit: 2011-12-16 | Discharge: 2011-12-16 | Disposition: A | Discharge status: Home / Self Care | Payer: BC Managed Care – PPO | Attending: Emergency Medicine | Admitting: Emergency Medicine

## 2011-12-16 DIAGNOSIS — Z79899 Other long term (current) drug therapy: Secondary | ICD-10-CM | POA: Insufficient documentation

## 2011-12-16 DIAGNOSIS — J45909 Unspecified asthma, uncomplicated: Secondary | ICD-10-CM | POA: Insufficient documentation

## 2011-12-16 DIAGNOSIS — M7989 Other specified soft tissue disorders: Secondary | ICD-10-CM | POA: Insufficient documentation

## 2011-12-16 DIAGNOSIS — R269 Unspecified abnormalities of gait and mobility: Secondary | ICD-10-CM | POA: Insufficient documentation

## 2011-12-16 DIAGNOSIS — M79609 Pain in unspecified limb: Secondary | ICD-10-CM | POA: Insufficient documentation

## 2011-12-16 DIAGNOSIS — Z7982 Long term (current) use of aspirin: Secondary | ICD-10-CM | POA: Insufficient documentation

## 2011-12-16 DIAGNOSIS — I824Z9 Acute embolism and thrombosis of unspecified deep veins of unspecified distal lower extremity: Secondary | ICD-10-CM | POA: Insufficient documentation

## 2011-12-16 DIAGNOSIS — O2230 Deep phlebothrombosis in pregnancy, unspecified trimester: Secondary | ICD-10-CM

## 2011-12-16 DIAGNOSIS — R609 Edema, unspecified: Secondary | ICD-10-CM | POA: Insufficient documentation

## 2011-12-16 LAB — DIFFERENTIAL
Eosinophils Absolute: 0.1 10*3/uL (ref 0.0–0.7)
Lymphs Abs: 2.3 10*3/uL (ref 0.7–4.0)
Monocytes Absolute: 0.3 10*3/uL (ref 0.1–1.0)
Neutrophils Relative %: 56 % (ref 43–77)

## 2011-12-16 LAB — POCT I-STAT, CHEM 8
BUN: 9 mg/dL (ref 6–23)
Calcium, Ion: 1.19 mmol/L (ref 1.12–1.32)
Creatinine, Ser: 1 mg/dL (ref 0.50–1.35)
Hemoglobin: 14.6 g/dL (ref 13.0–17.0)
Sodium: 142 mEq/L (ref 135–145)
TCO2: 28 mmol/L (ref 0–100)

## 2011-12-16 LAB — CBC
MCH: 23.3 pg — ABNORMAL LOW (ref 26.0–34.0)
MCHC: 33.8 g/dL (ref 30.0–36.0)
MCV: 69.1 fL — ABNORMAL LOW (ref 78.0–100.0)
Platelets: 299 10*3/uL (ref 150–400)

## 2011-12-16 LAB — PROTIME-INR: Prothrombin Time: 13.7 seconds (ref 11.6–15.2)

## 2011-12-16 MED ORDER — ENOXAPARIN SODIUM 150 MG/ML ~~LOC~~ SOLN: 115.0000 mg | Freq: Two times a day (BID) | SUBCUTANEOUS | Status: DC

## 2011-12-16 MED ORDER — ENOXAPARIN SODIUM 150 MG/ML ~~LOC~~ SOLN: 115.0000 mg | Freq: Two times a day (BID) | SUBCUTANEOUS | Status: AC

## 2011-12-16 MED ORDER — WARFARIN SODIUM 5 MG PO TABS: 10.0000 mg | ORAL_TABLET | Freq: Every day | ORAL | Status: AC

## 2011-12-16 MED ORDER — COUMADIN BOOK
Freq: Once | Status: AC
  Administered 2011-12-16: 21:00:00
  Filled 2011-12-16: qty 1

## 2011-12-16 MED ORDER — WARFARIN VIDEO
Freq: Once | Status: AC
  Administered 2011-12-16: 21:00:00

## 2011-12-16 MED ORDER — WARFARIN SODIUM 5 MG PO TABS: 10.0000 mg | ORAL_TABLET | Freq: Every day | ORAL | Status: DC

## 2011-12-16 MED ORDER — WARFARIN - PHARMACIST DOSING INPATIENT: Freq: Every day | Status: DC

## 2011-12-16 MED ORDER — ENOXAPARIN SODIUM 60 MG/0.6ML ~~LOC~~ SOLN
113.0000 mg | Freq: Once | SUBCUTANEOUS | Status: AC
  Administered 2011-12-16: 115 mg via SUBCUTANEOUS
  Filled 2011-12-16: qty 1.2

## 2011-12-16 MED ORDER — ENOXAPARIN SODIUM 150 MG/ML ~~LOC~~ SOLN: 113.0000 mg | Freq: Two times a day (BID) | SUBCUTANEOUS | Status: AC

## 2011-12-16 MED ORDER — ENOXAPARIN (LOVENOX) PATIENT EDUCATION KIT
PACK | Freq: Once | Status: AC
  Administered 2011-12-16: 19:00:00
  Filled 2011-12-16: qty 1

## 2011-12-16 MED ORDER — WARFARIN SODIUM 10 MG PO TABS
10.0000 mg | ORAL_TABLET | Freq: Once | ORAL | Status: AC
  Administered 2011-12-16: 10 mg via ORAL
  Filled 2011-12-16: qty 1

## 2011-12-16 NOTE — Progress Notes (Signed)
ANTICOAGULATION CONSULT NOTE - Initial Consult  Pharmacy Consult for Coumadin Indication:   Allergies  Allergen Reactions  . Fish Allergy Anaphylaxis    Patient Measurements: Height: 6\' 1"  (185.4 cm) Weight: 250 lb (113.399 kg) IBW/kg (Calculated) : 79.9   Vital Signs: Temp: 98.7 F (37.1 C) (05/21 1709) Temp src: Oral (05/21 1709) BP: 118/84 mmHg (05/21 1709) Pulse Rate: 88  (05/21 1709)  Labs: No results found for this basename: HGB:2,HCT:3,PLT:3,APTT:3,LABPROT:3,INR:3,HEPARINUNFRC:3,CREATININE:3,CKTOTAL:3,CKMB:3,TROPONINI:3 in the last 72 hours  CrCl is unknown because no creatinine reading has been taken.   Medical History: Past Medical History  Diagnosis Date  . Seasonal allergies   . Asthma     prn inhaler - triggered by allergies  . Lateral malleolar fracture 11/28/2011    right  . Abrasions of multiple sites 11/28/2011    bilat. hands  . Laceration of palm 11/28/2011    right; has sutures   Assessment:  30 YOAAM s/p ORIF of R ankle fracture on 5/8.  Patient presented 5/21 stating R leg pain seen by ortho who sent for US doppler of R leg and found + for DVT in R peroneal veins.    Patient wts 113.4 kg.  CrCl > 100 ml/min.  CBC and baseline PT/INR wnl.  Coumadin score of 12.  Patient has Lovenox 115mg  sq x 1 ordered along with Lovenox Education Kit.    Discussed with RN, the plans is to discharge patient on Lovenox and Coumadin.    MD will order for PT/INR   Goal of Therapy:  INR 2-3 Monitor platelets by anticoagulation protocol: Yes   Plan:     Coumadin 10 mg po x 1 tonight  Recommendation on discharge:  Lovenox 115 mg sq q12h along with Coumadin 10 mg po daily and PT/INR f/u within 2-3 days.  Geoffry Paradise Thi 12/16/2011,6:07 PM

## 2011-12-16 NOTE — Progress Notes (Signed)
ED CM consulted by NP about medication assistance for lovenox/coumadin and pcp .  CM noted pt has [employer] coverage CM explained that pt is not eligible for [facility] indigent medication assistance (he has insurance coverage) and the cost would be one of [employer]'s medication tier co pays depending on pt's plan.  CM unable to obtain cost until Rx is processed at pt's local pharmacy.  PCP can be obtained by pt contacting toll free number, speaking with a customer service representative or pt reviewing a list of pcp via [employer] (find a doctor) Cm consulted Tre at [facility] pharmacy who confirms Rx needs to be processed prior to knowing cost.  Cm spoke with pt and wife who states he uses [facility] at [phone] ([place]) [name], [place] [address] [phone].  Reviewed  [employer]'s medication tier co pays depending on pt's plan. Voiced understanding.  CM spoke with Fulton Mole at [place] who confirms Rx needs to be processed prior to knowing cost but able to provide self pay pt cost for 100 mg injection at average of $50. CM unable to reach customer service of [employer] at [phone].  Pt and wife updated and voiced understanding.

## 2011-12-16 NOTE — ED Notes (Signed)
Bed:WA20<BR> Expected date:<BR> Expected time:<BR> Means of arrival:<BR> Comments:<BR>

## 2011-12-16 NOTE — Discharge Instructions (Signed)
Mr [name] we will treat your DVT today with lovenox and coumadin.  You will take the coumadin daily in the afternoon and the lovenox shots once in the am once in the pm.  Get a doctor from the list below. [facility] will check INR levels)  Stop the [place].     RESOURCE GUIDE  Dental Problems  Patients with Medicaid: [facility] [phone] [address]                                           [phone] [address] Phone:  [phone]                                                  Phone:  [phone]  If unable to pay or uninsured, contact:  [facility] or [facility] to become qualified for the adult dental clinic.  Chronic Pain Problems Contact [facility]  [phone] Patients need to be referred by their primary care doctor.  Insufficient Money for Medicine Contact [employer]:  call "211" or [facility] 820-775-3469.  No Primary Care Doctor Call [facility]  [phone] Other agencies that provide inexpensive medical care    [facility]  [phone]    [facility]  [phone]    [facility]  [phone]    [facility]  [phone]    [facility]  5102958397    [facility]  [phone]  [facility]  [phone] [facility] Services  [phone] [facility]   [phone] (emergency services [phone])  Substance Abuse Resources [facility]  [phone] [facility] [phone] [facility] [place] 3650035072 Floydene Flock (325) [phone] Residential & Outpatient Substance Abuse Program  [phone]  Abuse/Neglect [facility] Child Abuse Hotline 209-746-8075 [facility] Child Abuse Hotline 616-280-2523 (After Hours)  Emergency Shelter [facility] [phone]  M[facility] [place] [facility] ([phone] [facility] [phone]  MRSA Hotline #:   661-870-2477    [facility] Resources  [facility]     [employer]                          [facility] Dept. [address] [place]                       [address]      [address]  [place]                                                Cristobal Goldmann Phone:  [phone]  Phone:  [phone]                 Phone:  [phone]  [facility] Phone:  [phone]  [facility] Child Abuse Hotline (858) 215-8325 6715505223 (After Hours) Deep Vein Thrombosis A deep vein thrombosis (DVT) is a blood clot (thrombus) that develops in a deep vein. A DVT is a clot in the deep, larger veins of the leg, arm, or pelvis. These are more dangerous than clots that might form in veins on the surface of the body. Deep vein thrombosis can lead to complications if the clot breaks off and travels in the bloodstream to the lungs. CAUSES Blood clots form in a vein for different reasons. Usually several things cause blood clots. They include:  The flow of blood slows down.   The inside of the vein is damaged in some way.   The person has a condition that makes blood clot more easily. These conditions may include:   Older age (especially over 6 years old).   Having a history of blood clots.   Having major or lengthy surgery. Hip surgery is particularly high-risk.   Breaking a hip or leg.   Sitting or lying still for a long time.   Cancer or cancer treatment.   Having a long, thin tube (catheter) placed inside a vein during a medical procedure.   Being overweight (obese).   Pregnancy and childbirth.   Medicines with estrogen.   Smoking.   Other circulation or heart problems.  SYMPTOMS When a clot forms, it can either partially or totally block the blood flow in that vein. Symptoms of  a DVT can include:  Swelling of the leg or arm, especially if one side is much worse.   Warmth and redness of the leg or arm, especially if one side is much worse.   Pain in an arm or leg. If the clot is in the leg, symptoms may be more noticeable or worse when standing or walking.  If the blood clot travels to the lung, it may cause:  Shortness of breath.   Chest pain. The pain may be worsened by deep breaths.   Coughing up thick mucus (phlegm), possibly flecked with blood.  Anyone with these symptoms should get emergency medical treatment right away. Call your local emergency services (911 in [place] if you have these symptoms. DIAGNOSIS If a DVT is suspected, your caregiver will take a full medical history. He or she will also perform a physical exam. Tests that also may be required include:  Studies of the clotting properties of the blood.   An ultrasound scan.   X-rays to show the flow of blood when special dye is injected into the veins (venography).   Studies of your lungs if you have any chest symptoms.  PREVENTION  Exercise the legs regularly. Take a brisk 30 minute walk every day.   Maintain a weight that is appropriate for your height.   Avoid sitting or lying in bed for long periods of time without moving your legs.   Women, particularly those over the age of 27, should consider the risks and benefits of taking estrogen medicines, including birth control pills.   Do not smoke, especially if you take estrogen medicines.   Long-distance travel can increase your risk. You should exercise your legs by walking or pumping the muscles every hour.   In hospital prevention:   Prevention may include medical and nonmedical measures.  TREATMENT  The most  common treatment for DVT is blood thinning (anticoagulant) medicine, which reduces the blood's tendency to clot. Anticoagulants can stop new blood clots from forming and old ones from growing. They cannot dissolve existing  clots. Your body does this by itself over time. Anticoagulants can be given by mouth, by intravenous (IV) access, or by injection. Your caregiver will determine the best program for you.   Less commonly, clot-dissolving drugs (thrombolytics) are used to dissolve a DVT. They carry a high risk of bleeding, so they are used mainly in severe cases.   Very rarely, a blood clot in the leg needs to be removed surgically.   If you are unable to take anticoagulants, your caregiver may arrange for you to have a filter placed in a main vein in your belly (abdomen). This filter prevents clots from traveling to your lungs.  HOME CARE INSTRUCTIONS  Take all medicines prescribed by your caregiver. Follow the directions carefully.   You will most likely continue taking anticoagulants after you leave the hospital. Your caregiver will advise you on the length of treatment (usually 3 to 6 months, sometimes for life).   Taking too much or too little of an anticoagulant is dangerous. While taking this type of medicine, you will need to have regular blood tests to be sure the dose is correct. The dose can change for many reasons. It is critically important that you take this medicine exactly as prescribed, and that you have blood tests exactly as directed.   Many foods can interfere with anticoagulants. These include foods high in vitamin K, such as spinach, kale, broccoli, cabbage, collard and turnip greens, Brussels sprouts, peas, cauliflower, seaweed, parsley, beef and pork liver, green tea, and soybean oil. Your caregiver should discuss limits on these foods with you or you should arrange a visit with a dietician to answer your questions.   Many medicines can interfere with anticoagulants. You must tell your caregiver about any and all medicines you take. This includes all vitamins and supplements. Be especially cautious with aspirin and anti-inflammatory medicines. Ask your caregiver before taking these.    Anticoagulants can have side effects, mostly excessive bruising or bleeding. You will need to hold pressure over cuts for longer than usual. Avoid alcoholic drinks or consume only very small amounts while taking this medicine.   If you are taking an anticoagulant:   Wear a medical alert bracelet.   Notify your dentist or other caregivers before procedures.   Avoid contact sports.   Ask your caregiver how soon you can go back to normal activities. Not being active can lead to new clots. Ask for a list of what you should and should not do.   Exercise your lower leg muscles. This is important while traveling.   You may need to wear compression stockings. These are tight elastic stockings that apply pressure to the lower legs. This can help keep the blood in the legs from clotting.   If you are a smoker, you should quit.   Learn as much as you can about DVT.  SEEK MEDICAL CARE IF:  You have unusual bruising or any bleeding problems.   The swelling or pain in your affected arm or leg is not gradually improving.   You anticipate surgery or long-distance travel. You should get specific advice on DVT prevention.   You discover other family members with blood clots. This may require further testing for inherited diseases or conditions.  SEEK IMMEDIATE MEDICAL CARE IF:  You develop chest  pain.   You develop severe shortness of breath.   You begin to cough up bloody mucus or phlegm (sputum).   You feel dizzy or faint.   You develop swelling or pain in the leg.   You have breathing problems after traveling.  MAKE SURE YOU:  Understand these instructions.   Will watch your condition.   Will get help right away if you are not doing well or get worse.  Document Released: 07/14/2005 Document Revised: 07/03/2011 Document Reviewed: 09/05/2010 [facility] Patient Information 2012 [place], [place].

## 2011-12-16 NOTE — ED Provider Notes (Signed)
History     CSN: [id number]  Arrival date & time 12/16/11  1700   First MD Initiated Contact with Patient 12/16/11 1720      Chief Complaint  Patient presents with  . DVT    (Consider location/radiation/quality/duration/timing/severity/associated sxs/prior treatment) Patient is a 31 y.o. male presenting with leg pain. The history is provided by the patient. No language interpreter was used.  Leg Pain  The incident occurred 3 to 5 hours ago. Incident location: [facility]. The quality of the pain is described as throbbing. The pain is at a severity of 7/10. The pain is moderate. The pain has been intermittent since onset. Pertinent negatives include no numbness.   31 year old male coming in today with complaint of right lower extremity DVT. Patient had surgery on his right ankle on 12/03/2011. Coming in today with right calf pain. He went to [facility] cardiovascular and have a Doppler studies which confirmed a DVT. Here in the ER to receive Lovenox kit and Coumadin prescription. Initially he fell off a scooter and fractured his ankle. Patient has a locator band around his left ankle.  Past Medical History  Diagnosis Date  . Seasonal allergies   . Asthma     prn inhaler - triggered by allergies  . Lateral malleolar fracture 11/28/2011    right  . Abrasions of multiple sites 11/28/2011    bilat. hands  . Laceration of palm 11/28/2011    right; has sutures    Past Surgical History  Procedure Date  . No past surgeries   . Orif ankle fracture 12/03/2011    Procedure: OPEN REDUCTION INTERNAL FIXATION (ORIF) ANKLE FRACTURE;  Surgeon: Sherri Rad, MD;  Location: [facility];  Service: Orthopedics;  Laterality: Right;  ORIF right lateral malleolus fracture and stress x-rays foot    History reviewed. No pertinent family history.  History  Substance Use Topics  . Smoking status: Current Everyday Smoker -- 1.0 packs/day for 13 years    Types: Cigarettes  .  Smokeless tobacco: Never Used  . Alcohol Use: Yes     5-7 drinks/week      Review of Systems  Constitutional: Negative.  Negative for fever.  HENT: Negative.   Eyes: Negative.   Respiratory: Negative.  Negative for cough, shortness of breath and wheezing.   Cardiovascular: Negative.   Gastrointestinal: Negative.  Negative for nausea and vomiting.  Musculoskeletal: Positive for gait problem.       R calf pain and swelling  Neurological: Negative.  Negative for dizziness, weakness, numbness and headaches.  Psychiatric/Behavioral: Negative.   All other systems reviewed and are negative.    Allergies  Fish allergy  Home Medications   Current Outpatient Rx  Name Route Sig Dispense Refill  . ALBUTEROL SULFATE HFA 108 (90 BASE) MCG/ACT IN AERS Inhalation Inhale 2 puffs into the lungs every 6 (six) hours as needed. Shortness of breath    . ASPIRIN 325 MG PO TABS Oral Take 325 mg by mouth daily.    Marland Kitchen DOCUSATE SODIUM 100 MG PO CAPS Oral Take 100 mg by mouth 2 (two) times daily.    Marland Kitchen METHOCARBAMOL 500 MG PO TABS Oral Take 500 mg by mouth 3 (three) times daily.    . OXYCODONE-ACETAMINOPHEN 5-325 MG PO TABS Oral Take 1 tablet by mouth every 4 (four) hours as needed. pain    . VITAMIN C 500 MG PO TABS Oral Take 1 tablet (500 mg total) by mouth daily. 90 tablet 0  BP 118/84  Pulse 88  Temp(Src) 98.7 F (37.1 C) (Oral)  Resp 16  Ht 6\' 1"  (1.854 m)  Wt 250 lb (113.399 kg)  BMI 32.98 kg/m2  SpO2 99%  Physical Exam  Nursing note and vitals reviewed. Constitutional: He is oriented to person, place, and time. He appears well-developed and well-nourished.  HENT:  Head: Normocephalic.  Eyes: Conjunctivae and EOM are normal. Pupils are equal, round, and reactive to light.  Neck: Normal range of motion. Neck supple.  Cardiovascular: Normal rate.   Pulmonary/Chest: Effort normal and breath sounds normal. No respiratory distress. He has no wheezes. He has no rales.  Abdominal: Soft.  Bowel sounds are normal. There is no tenderness.  Musculoskeletal: Normal range of motion. He exhibits edema and tenderness.       RLE calf tenderness  Neurological: He is alert and oriented to person, place, and time. No cranial nerve deficit. Coordination normal.  Skin: Skin is warm and dry.  Psychiatric: He has a normal mood and affect.    ED Course  Procedures (including critical care time)  Labs Reviewed - No data to display No results found.   No diagnosis found.    MDM  Patient here with RLE dvt report to be put on dvt meds after recent surgery.  Lovenox and coumadin dosed in the ER.  Rx for both.  Will find a pcp to follow him and check his INR's.  First check in 2-3 days.  Return if SOB or chest pain.  Care manager recommended resources.          Remi Haggard, NP 12/17/11 1338

## 2011-12-16 NOTE — ED Notes (Signed)
Pt reports recent surgery on right ankle, developed right leg pain f/u with orthopedic doctor and sent to has US doppler of right leg which was positive for DVT in right peroneal veins.

## 2011-12-18 NOTE — ED Provider Notes (Signed)
Medical screening examination/treatment/procedure(s) were performed by non-physician practitioner and as supervising physician I was immediately available for consultation/collaboration.  Flint Melter, MD 12/18/11 [phone]

## 2021-05-07 ENCOUNTER — Emergency Department (HOSPITAL_COMMUNITY)
Admission: EM | Admit: 2021-05-07 | Discharge: 2021-05-07 | Disposition: A | Discharge status: Home / Self Care | Payer: BC Managed Care – PPO | Attending: Emergency Medicine | Admitting: Emergency Medicine

## 2021-05-07 ENCOUNTER — Encounter (HOSPITAL_COMMUNITY): Payer: Self-pay

## 2021-05-07 ENCOUNTER — Emergency Department (HOSPITAL_COMMUNITY): Payer: BC Managed Care – PPO

## 2021-05-07 ENCOUNTER — Other Ambulatory Visit: Payer: Self-pay

## 2021-05-07 DIAGNOSIS — Z7982 Long term (current) use of aspirin: Secondary | ICD-10-CM | POA: Insufficient documentation

## 2021-05-07 DIAGNOSIS — W228XXA Striking against or struck by other objects, initial encounter: Secondary | ICD-10-CM | POA: Diagnosis not present

## 2021-05-07 DIAGNOSIS — M25539 Pain in unspecified wrist: Secondary | ICD-10-CM | POA: Insufficient documentation

## 2021-05-07 DIAGNOSIS — S52611A Displaced fracture of right ulna styloid process, initial encounter for closed fracture: Secondary | ICD-10-CM | POA: Diagnosis not present

## 2021-05-07 DIAGNOSIS — F17210 Nicotine dependence, cigarettes, uncomplicated: Secondary | ICD-10-CM | POA: Diagnosis not present

## 2021-05-07 DIAGNOSIS — S52571A Other intraarticular fracture of lower end of right radius, initial encounter for closed fracture: Secondary | ICD-10-CM

## 2021-05-07 DIAGNOSIS — J45909 Unspecified asthma, uncomplicated: Secondary | ICD-10-CM | POA: Diagnosis not present

## 2021-05-07 DIAGNOSIS — S6991XA Unspecified injury of right wrist, hand and finger(s), initial encounter: Secondary | ICD-10-CM | POA: Diagnosis present

## 2021-05-07 DIAGNOSIS — Z7901 Long term (current) use of anticoagulants: Secondary | ICD-10-CM | POA: Insufficient documentation

## 2021-05-07 DIAGNOSIS — Z5321 Procedure and treatment not carried out due to patient leaving prior to being seen by health care provider: Secondary | ICD-10-CM | POA: Insufficient documentation

## 2021-05-07 NOTE — ED Provider Notes (Signed)
[facility]-EMERGENCY DEPT Provider Note   CSN: [id number] Arrival date & time: 05/07/21  1025     History Chief Complaint  Patient presents with   Wrist Injury    [name] is a 40 y.o. male.  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  States he did not strike her person and understands that this is critical information.  He states that he does not have any pain in his hand but has significant right wrist pain.  He states he did not have any abrasions lacerations or bleeding.  He states that he has had achy constant 5/10 pain since the time when he punched a wall.  No other injuries.  No other symptoms.  He states the pain is worse with touch and movement of his wrist denies any chest pain shortness of breath lightheadedness or dizziness.  No other associate symptoms.  Has taken no medications prior to arrival for pain.  No radiation of symptoms.     Past Medical History:  Diagnosis Date   Abrasions of multiple sites 11/28/2011   bilat. hands   Asthma    prn inhaler - triggered by allergies   Laceration of palm 11/28/2011   right; has sutures   Lateral malleolar fracture 11/28/2011   right   Seasonal allergies     There are no problems to display for this patient.   Past Surgical History:  Procedure Laterality Date   NO PAST SURGERIES     ORIF ANKLE FRACTURE  12/03/2011   Procedure: OPEN REDUCTION INTERNAL FIXATION (ORIF) ANKLE FRACTURE;  Surgeon: Sherri Rad, MD;  Location: [facility];  Service: Orthopedics;  Laterality: Right;  ORIF right lateral malleolus fracture and stress x-rays foot       Family History  Problem Relation Age of Onset   Cancer Mother     Social History   Tobacco Use   Smoking status: Every Day    Packs/day: 1.00    Years: 13.00    Pack years:  13.00    Types: Cigarettes   Smokeless tobacco: Never  Vaping Use   Vaping Use: Never used  Substance Use Topics   Alcohol use: Yes   Drug use: No    Home Medications Prior to Admission medications   Medication Sig Start Date End Date Taking? Authorizing Provider  albuterol (PROVENTIL HFA;VENTOLIN HFA) 108 (90 BASE) MCG/ACT inhaler Inhale 2 puffs into the lungs every 6 (six) hours as needed. Shortness of breath    [provider]  aspirin 325 MG tablet Take 325 mg by mouth daily.    [provider]  docusate sodium (COLACE) 100 MG capsule Take 100 mg by mouth 2 (two) times daily.    [provider]  enoxaparin (LOVENOX) 150 MG/ML injection Inject 0.75 mLs (115 mg total) into the skin every 12 (twelve) hours. 12/16/11   Jethro Bastos, NP  enoxaparin (LOVENOX) 150 MG/ML injection Inject 0.77 mLs (115 mg total) into the skin every 12 (twelve) hours. 12/16/11   Cherrie Distance, PA-C  methocarbamol (ROBAXIN) 500 MG tablet Take 500 mg by mouth 3 (three) times daily.    [provider]  oxyCODONE-acetaminophen (PERCOCET) 5-325 MG per tablet Take 1 tablet by mouth every 4 (four) hours as needed. pain    [provider]  warfarin (  COUMADIN) 5 MG tablet Take 2 tablets (10 mg total) by mouth daily. 12/16/11 12/15/12  Cherrie Distance, PA-C    Allergies    Fish allergy  Review of Systems   Review of Systems  Constitutional:  Negative for chills and fever.  HENT:  Negative for congestion.   Respiratory:  Negative for shortness of breath.   Cardiovascular:  Negative for chest pain.  Gastrointestinal:  Negative for abdominal pain.  Musculoskeletal:  Negative for neck pain.       Right wrist pain   Physical Exam Updated Vital Signs BP (!) 152/96 (BP Location: Left Arm)   Pulse 98   Temp 98.9 F (37.2 C) (Oral)   Resp 19   Ht 6\' 1"  (1.854 m)   Wt 119.7 kg   SpO2 98%   BMI 34.82 kg/m   Physical Exam Vitals and nursing note reviewed.   Constitutional:      General: He is not in acute distress.    Appearance: Normal appearance. He is not ill-appearing.  HENT:     Head: Normocephalic and atraumatic.  Eyes:     General: No scleral icterus.       Right eye: No discharge.        Left eye: No discharge.     Conjunctiva/sclera: Conjunctivae normal.  Pulmonary:     Effort: Pulmonary effort is normal.     Breath sounds: No stridor.  Musculoskeletal:     Comments: Right upper extremity with right scaphoid and ulnar wrist tenderness to palpation.  Fifth metacarpal with mild tenderness no deformity.  No forearm tenderness to palpation.  No elbow pain humerus or shoulder pain.  No midline C, T, L-spine tenderness to palpation.  Skin:    General: Skin is warm and dry.     Capillary Refill: Capillary refill takes less than 2 seconds.     Coloration: Skin is not jaundiced.     Comments: No abrasions or puncture marks of the knuckles. No wounds  Neurological:     Mental Status: He is alert and oriented to person, place, and time. Mental status is at baseline.     Comments: Sensation intact in all digits of right upper extremity  Psychiatric:        Mood and Affect: Mood normal.        Behavior: Behavior normal.    ED Results / Procedures / Treatments   Labs (all labs ordered are listed, but only abnormal results are displayed) Labs Reviewed - No data to display  EKG None  Radiology DG Hand Complete Right  Result Date: 05/07/2021 CLINICAL DATA:  Right hand and wrist injury, pain.  Punched wall. EXAM: RIGHT HAND - COMPLETE 3+ VIEW COMPARISON:  11/28/2011 FINDINGS: There is a distal right radial intra-articular fracture. Mildly displaced fracture fragments. Ulnar styloid fracture noted. No subluxation or dislocation. IMPRESSION: Displaced, intra-articular distal right radial fracture and ulnar styloid fracture. Electronically Signed   By: 01/28/2012 M.D.   On: 05/07/2021 11:38    Procedures Procedures  SPLINT  APPLICATION Date/Time: 3:08 PM Authorized by: 07/07/2021 Consent: Verbal consent obtained. Risks and benefits: risks, benefits and alternatives were discussed Consent given by: patient Splint applied by: orthopedic technician Location details: RUE Splint type: sugar tong Supplies used: ortho glass Post-procedure: The splinted body part was neurovascularly unchanged following the procedure. Patient tolerance: Patient tolerated the procedure well with no immediate complications.   Medications Ordered in ED Medications - No data to display  ED Course  I have reviewed the triage vital signs and the nursing notes.  Pertinent labs & imaging results that were available during my care of the patient were reviewed by me and considered in my medical decision making (see chart for details).    MDM Rules/Calculators/A&P                           Patient with intra-articular distal radius fracture and ulnar styloid fracture.  Reviewed x-ray imaging.  Patient will be placed in a sugar-tong splint.  We will follow-up with orthopedics.  States his pain is relatively minimal at this time recommend Tylenol ibuprofen rest ice elevation compression and he understands the need to maintain the splint until he sees by orthopedics.  He also understands the extreme importance of following up with orthopedics he is right-handed and has an intra articular fracture.  He understands that loss of mobility and permanent dysfunction of right upper extremity could be the result of failing to follow-up and failing to wear splints.  He is distally neurovascularly intact before and after splint placement.  Final Clinical Impression(s) / ED Diagnoses Final diagnoses:  Other closed intra-articular fracture of distal end of right radius, initial encounter  Closed displaced fracture of styloid process of right ulna, initial encounter    Rx / DC Orders ED Discharge Orders     None        Gailen Shelter,  [place] 05/07/21 1508    Mancel Bale, MD 05/08/21 [phone]

## 2021-05-07 NOTE — Discharge Instructions (Addendum)
Please call tomorrow morning to make an appointment for [facility].  Please wear the sugar-tong splint that you are placed in until you are seen by an orthopedic doctor.  It is very important to have follow-up with orthopedics otherwise--as we discussed--he may end up with decreased mobility of your wrist.  Please use Tylenol or ibuprofen for pain.  You may use 600 mg ibuprofen every 6 hours or 1000 mg of Tylenol every 6 hours.  You may choose to alternate between the 2.  This would be most effective.  Not to exceed 4 g of Tylenol within 24 hours.  Not to exceed 3200 mg ibuprofen 24 hours.

## 2021-05-07 NOTE — ED Triage Notes (Signed)
Patient states he punched a wall at 0600 today. Patient c/o right hand and wrist injury.

## 2022-03-05 IMAGING — CR DG HAND COMPLETE 3+V*R*
3 series · 3 of 3 positions shown · non-contrast
Comparison: 11/28/2011

CLINICAL DATA: Right hand and wrist injury, pain.  Punched wall.

EXAM:
RIGHT HAND - COMPLETE 3+ VIEW

[x hand pa right]
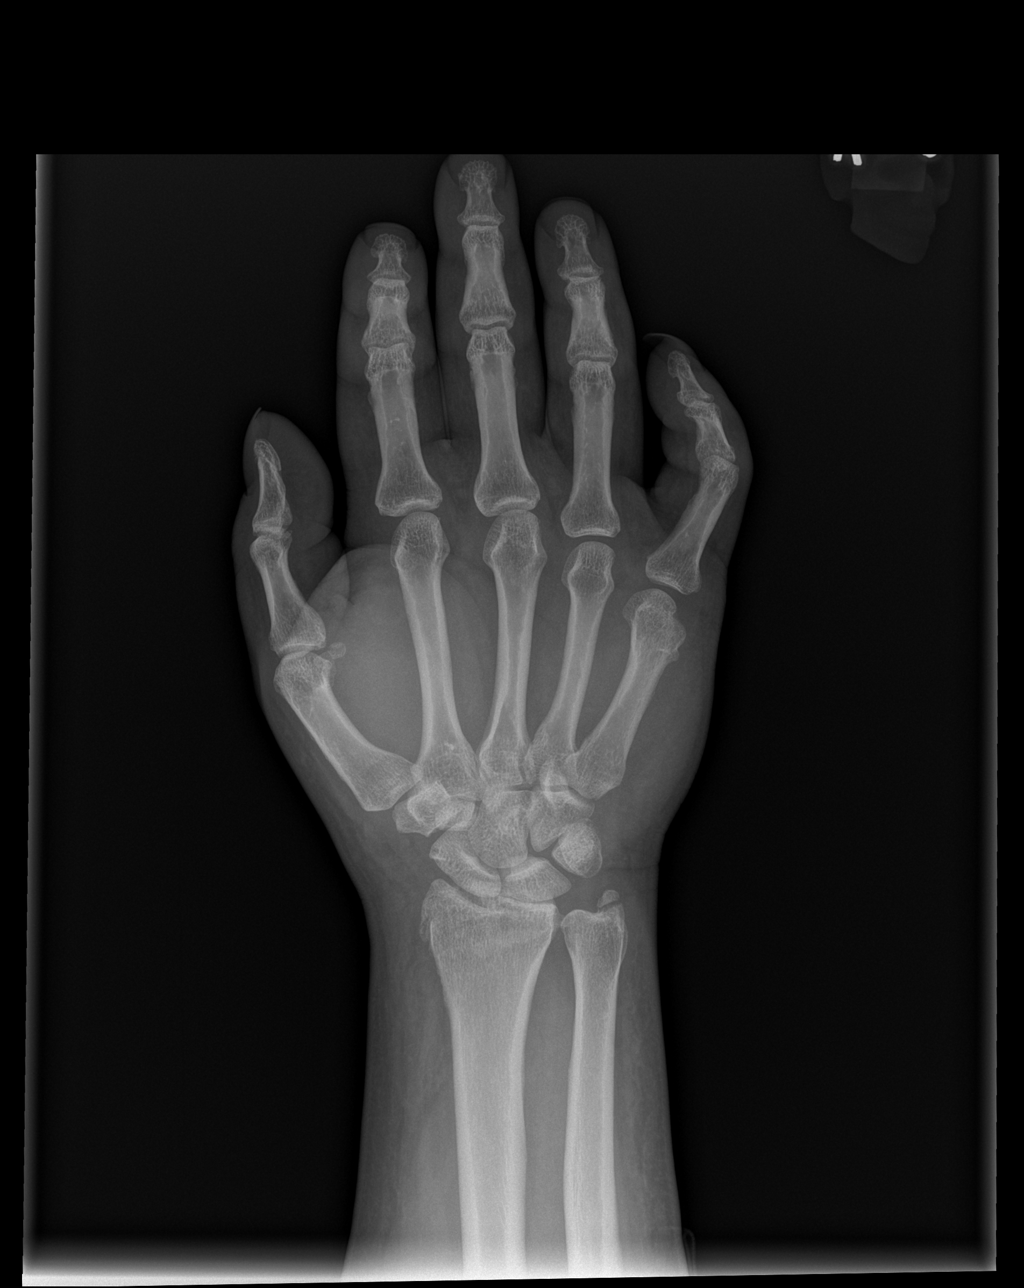

[x hand obl right]
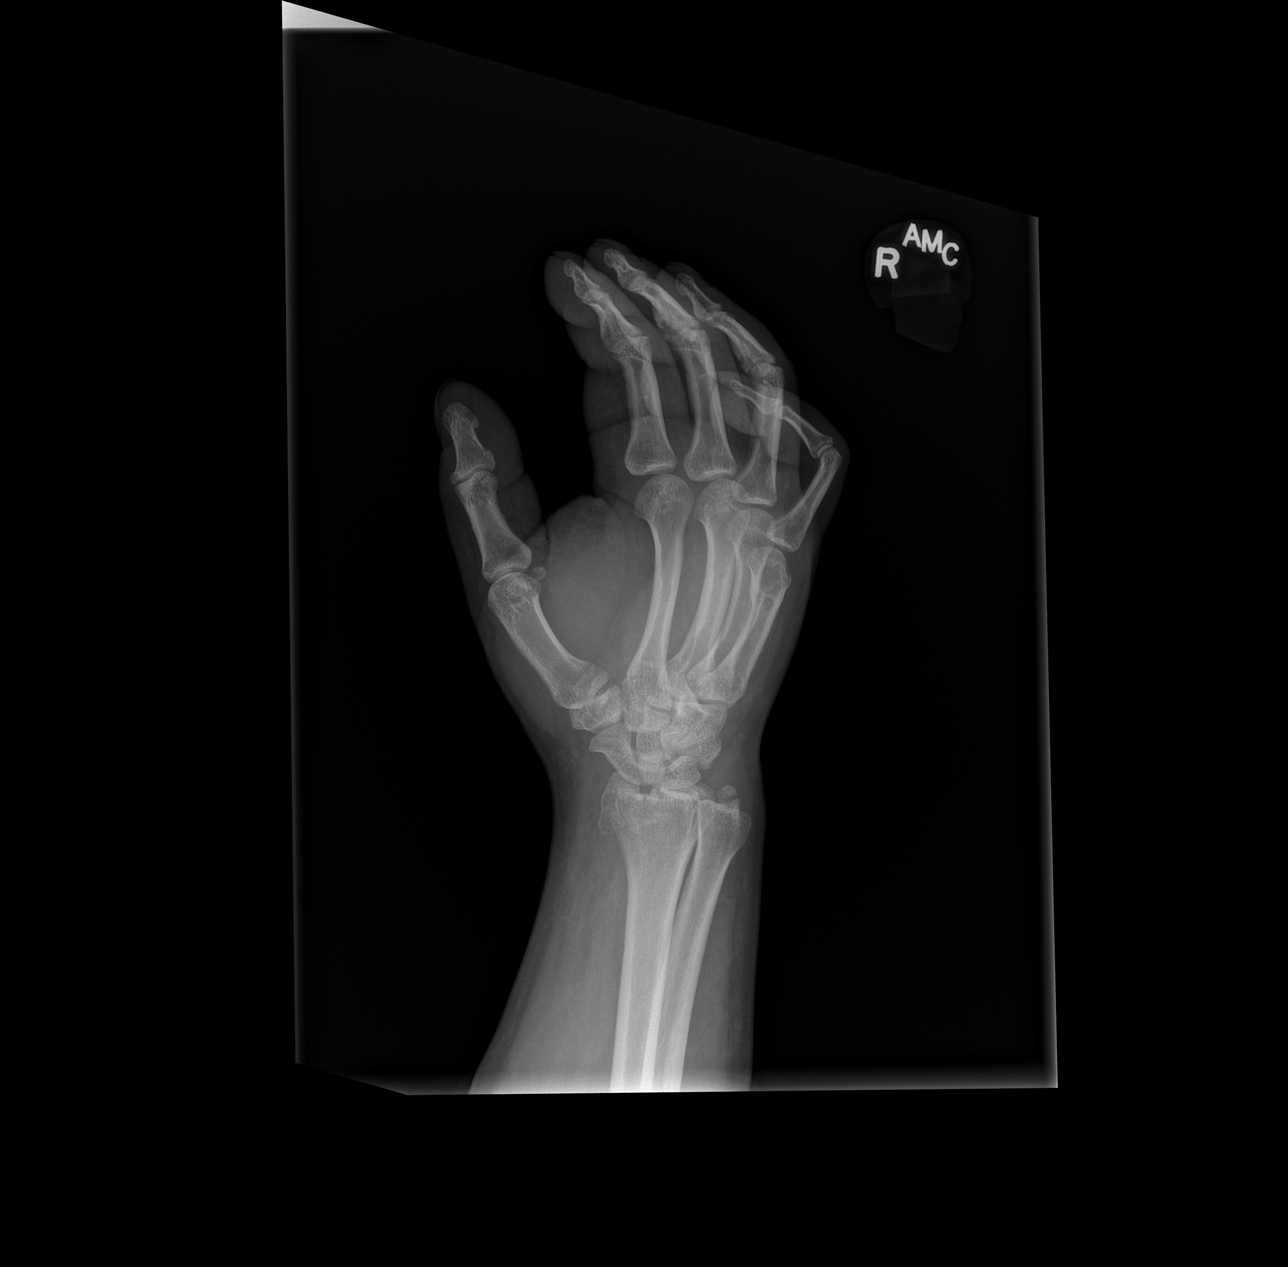

[x hand lat right]
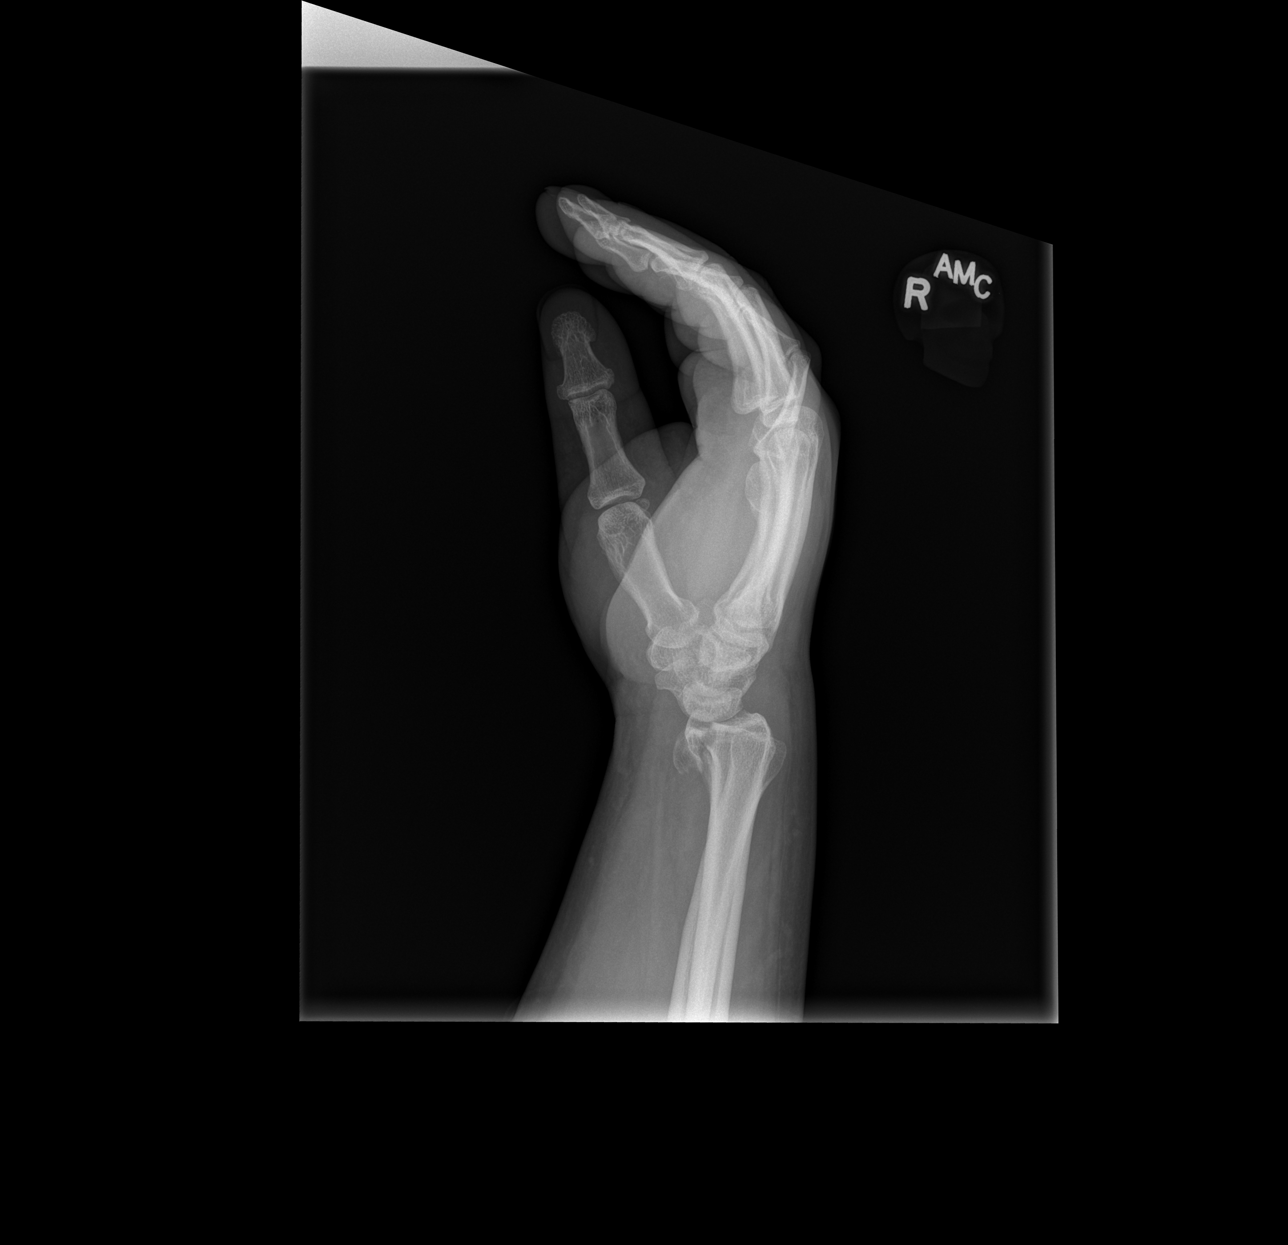

[3 of 3 positions shown; findings below may reference images not displayed]

FINDINGS: There is a distal right radial intra-articular fracture. Mildly
displaced fracture fragments. Ulnar styloid fracture noted. No
subluxation or dislocation.
IMPRESSION: Displaced, intra-articular distal right radial fracture and ulnar
styloid fracture.

## 2022-12-16 ENCOUNTER — Other Ambulatory Visit (HOSPITAL_BASED_OUTPATIENT_CLINIC_OR_DEPARTMENT_OTHER): Payer: Self-pay

## 2022-12-16 DIAGNOSIS — G4733 Obstructive sleep apnea (adult) (pediatric): Secondary | ICD-10-CM

## 2022-12-29 ENCOUNTER — Ambulatory Visit (HOSPITAL_BASED_OUTPATIENT_CLINIC_OR_DEPARTMENT_OTHER): Payer: BC Managed Care – PPO | Attending: Internal Medicine | Admitting: Internal Medicine

## 2022-12-29 VITALS — Ht 72.0 in | Wt 325.0 lb

## 2022-12-29 DIAGNOSIS — G4733 Obstructive sleep apnea (adult) (pediatric): Secondary | ICD-10-CM

## 2023-01-10 DIAGNOSIS — G4733 Obstructive sleep apnea (adult) (pediatric): Secondary | ICD-10-CM | POA: Diagnosis not present

## 2023-01-10 NOTE — Procedures (Signed)
   Patient Name: [name], [name] Date: 12/30/2022 Gender: Male D.O.B: [birth year] Age (years): 64 Referring Provider: Jamison Oka MD Height (inches): 72 Interpreting Physician: Jetty Duhamel MD, ABSM Weight (lbs): 325 RPSGT: [place] S[place] BMI: 44 MRN: [id number] Neck Size: <br>  CLINICAL INFORMATION Sleep Study Type: HST Indication for sleep study: OSA Epworth Sleepiness Score: 2  SLEEP STUDY TECHNIQUE A multi-channel overnight portable sleep study was performed. The channels recorded were: nasal airflow, thoracic respiratory movement, and oxygen saturation with a pulse oximetry. Snoring was also monitored.  MEDICATIONS Patient self administered medications include: none reported.  SLEEP ARCHITECTURE Patient was studied for 364.5 minutes. The sleep efficiency was 100.0 % and the patient was supine for 0%. The arousal index was 0.0 per hour.  RESPIRATORY PARAMETERS The overall AHI was 44.3 per hour, with a central apnea index of 0 per hour. The oxygen nadir was 79% during sleep.  CARDIAC DATA Mean heart rate during sleep was 70.8 bpm.  IMPRESSIONS - Severe obstructive sleep apnea occurred during this study (AHI = 44.3/h). - Oxygen desaturation was noted during this study (Min O2 = 79%, Mean 94%). - Patient snored.  DIAGNOSIS - Obstructive Sleep Apnea (G47.33)  RECOMMENDATIONS - Suggest CPAP titration sleep study or autopap. Other options would be based on clinical judgment. - Avoid alcohol, sedatives and other CNS depressants that may worsen sleep apnea and disrupt normal sleep architecture. - Sleep hygiene should be reviewed to assess factors that may improve sleep quality. - Weight management and regular exercise should be initiated or continued.  [Electronically signed] 01/10/2023 10:44 AM  Jetty Duhamel MD, ABSM Diplomate, American Board of Sleep Medicine NPI: [id number]                          Jetty Duhamel Diplomate,  American Board of Sleep Medicine  ELECTRONICALLY SIGNED ON:  01/10/2023, 10:42 AM [facility] PH: [phone]   FX: [phone] ACCREDITED BY THE [employer]ICAN [employer]Y OF SLEEP MEDICINE
